# Patient Record
Sex: Female | Born: 1990 | Race: White | Hispanic: No | Marital: Single
Health system: Southern US, Community
[De-identification: ages and names within clinical notes are randomized; demographics above are authoritative.]

---

## 2017-06-11 ENCOUNTER — Emergency Department (HOSPITAL_COMMUNITY): Payer: BLUE CROSS/BLUE SHIELD

## 2017-06-11 ENCOUNTER — Emergency Department (HOSPITAL_COMMUNITY)
Admission: EM | Admit: 2017-06-11 | Discharge: 2017-06-11 | Disposition: A | Discharge status: Home / Self Care | Payer: BLUE CROSS/BLUE SHIELD | Attending: Emergency Medicine | Admitting: Emergency Medicine

## 2017-06-11 ENCOUNTER — Encounter (HOSPITAL_COMMUNITY): Payer: Self-pay | Admitting: Emergency Medicine

## 2017-06-11 DIAGNOSIS — Z23 Encounter for immunization: Secondary | ICD-10-CM | POA: Insufficient documentation

## 2017-06-11 DIAGNOSIS — Y929 Unspecified place or not applicable: Secondary | ICD-10-CM | POA: Diagnosis not present

## 2017-06-11 DIAGNOSIS — Y9389 Activity, other specified: Secondary | ICD-10-CM | POA: Insufficient documentation

## 2017-06-11 DIAGNOSIS — Z1881 Retained glass fragments: Secondary | ICD-10-CM | POA: Insufficient documentation

## 2017-06-11 DIAGNOSIS — S060X1A Concussion with loss of consciousness of 30 minutes or less, initial encounter: Secondary | ICD-10-CM | POA: Diagnosis present

## 2017-06-11 DIAGNOSIS — Y999 Unspecified external cause status: Secondary | ICD-10-CM | POA: Insufficient documentation

## 2017-06-11 DIAGNOSIS — S61511A Laceration without foreign body of right wrist, initial encounter: Secondary | ICD-10-CM | POA: Insufficient documentation

## 2017-06-11 DIAGNOSIS — S0181XA Laceration without foreign body of other part of head, initial encounter: Secondary | ICD-10-CM | POA: Diagnosis not present

## 2017-06-11 LAB — I-STAT BETA HCG BLOOD, ED (MC, WL, AP ONLY): I-stat hCG, quantitative: <5 m[IU]/mL (ref <5)

## 2017-06-11 MED ORDER — IBUPROFEN 800 MG PO TABS: 800.0000 mg | ORAL_TABLET | Freq: Three times a day (TID) | ORAL | 0 refills | Status: AC

## 2017-06-11 MED ORDER — TETANUS-DIPHTH-ACELL PERTUSSIS 5-2.5-18.5 LF-MCG/0.5 IM SUSP
0.5000 mL | Freq: Once | INTRAMUSCULAR | Status: AC
  Administered 2017-06-11: 0.5 mL via INTRAMUSCULAR
  Filled 2017-06-11: qty 0.5

## 2017-06-11 MED ORDER — HYDROCODONE-ACETAMINOPHEN 5-325 MG PO TABS: 1.0000 | ORAL_TABLET | Freq: Four times a day (QID) | ORAL | 0 refills | Status: AC | PRN

## 2017-06-11 MED ORDER — CYCLOBENZAPRINE HCL 10 MG PO TABS: 10.0000 mg | ORAL_TABLET | Freq: Two times a day (BID) | ORAL | 0 refills | Status: AC | PRN

## 2017-06-11 MED ORDER — LIDOCAINE HCL (PF) 1 % IJ SOLN
5.0000 mL | Freq: Once | INTRAMUSCULAR | Status: AC
  Administered 2017-06-11: 5 mL
  Filled 2017-06-11: qty 5

## 2017-06-11 MED ORDER — HYDROCODONE-ACETAMINOPHEN 5-325 MG PO TABS
2.0000 | ORAL_TABLET | Freq: Once | ORAL | Status: AC
  Administered 2017-06-11: 2 via ORAL
  Filled 2017-06-11: qty 2

## 2017-06-11 NOTE — ED Notes (Signed)
Got patient on the monitor did vitals patient is resting with family at bedside
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
